# Patient Record
Sex: Female | Born: 1960 | Race: Black or African American | Hispanic: No | Marital: Married | Smoking: Never smoker
Health system: Southern US, Community
[De-identification: ages and names within clinical notes are randomized; demographics above are authoritative.]

---

## 2020-12-08 ENCOUNTER — Emergency Department (HOSPITAL_BASED_OUTPATIENT_CLINIC_OR_DEPARTMENT_OTHER): Payer: 59

## 2020-12-08 ENCOUNTER — Emergency Department (HOSPITAL_BASED_OUTPATIENT_CLINIC_OR_DEPARTMENT_OTHER)
Admission: EM | Admit: 2020-12-08 | Discharge: 2020-12-08 | Disposition: A | Discharge status: Home / Self Care | Payer: 59 | Attending: Emergency Medicine | Admitting: Emergency Medicine

## 2020-12-08 ENCOUNTER — Encounter (HOSPITAL_BASED_OUTPATIENT_CLINIC_OR_DEPARTMENT_OTHER): Payer: Self-pay | Admitting: *Deleted

## 2020-12-08 ENCOUNTER — Other Ambulatory Visit: Payer: Self-pay

## 2020-12-08 DIAGNOSIS — M25531 Pain in right wrist: Secondary | ICD-10-CM | POA: Insufficient documentation

## 2020-12-08 DIAGNOSIS — M542 Cervicalgia: Secondary | ICD-10-CM | POA: Insufficient documentation

## 2020-12-08 DIAGNOSIS — Y92410 Unspecified street and highway as the place of occurrence of the external cause: Secondary | ICD-10-CM | POA: Insufficient documentation

## 2020-12-08 DIAGNOSIS — M79604 Pain in right leg: Secondary | ICD-10-CM | POA: Insufficient documentation

## 2020-12-08 DIAGNOSIS — M545 Low back pain, unspecified: Secondary | ICD-10-CM | POA: Insufficient documentation

## 2020-12-08 DIAGNOSIS — M79605 Pain in left leg: Secondary | ICD-10-CM | POA: Insufficient documentation

## 2020-12-08 MED ORDER — ACETAMINOPHEN 500 MG PO TABS
1000.0000 mg | ORAL_TABLET | Freq: Once | ORAL | Status: AC
  Administered 2020-12-08: 1000 mg via ORAL
  Filled 2020-12-08: qty 2

## 2020-12-08 MED ORDER — METHOCARBAMOL 500 MG PO TABS: 500.0000 mg | ORAL_TABLET | Freq: Three times a day (TID) | ORAL | 0 refills | Status: AC | PRN
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
## 2020-12-08 NOTE — Discharge Instructions (Addendum)
The imaging of your neck, wrist, and back were all without any fractures.   You have an incidental finding of a nodule on your thyroid that I recommend following up with your PCP about  Your pain will likely worsen over the next couple of days. Prescribed muscle relaxer Robaxin 1 tablet every 8 hours as needed. Keep moving around and doing stretches. Return if you develop severe headaches, change in vision or loss of sensation.
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
## 2020-12-08 NOTE — ED Notes (Signed)
Patient transported to X-ray 

## 2022-11-07 IMAGING — CR DG LUMBAR SPINE COMPLETE 4+V
5 series · 5 of 5 positions shown · non-contrast
Comparison: None.

CLINICAL DATA: low back pain with radiation down legs

EXAM:
LUMBAR SPINE - COMPLETE 4+ VIEW

[t l-spine a.p.]
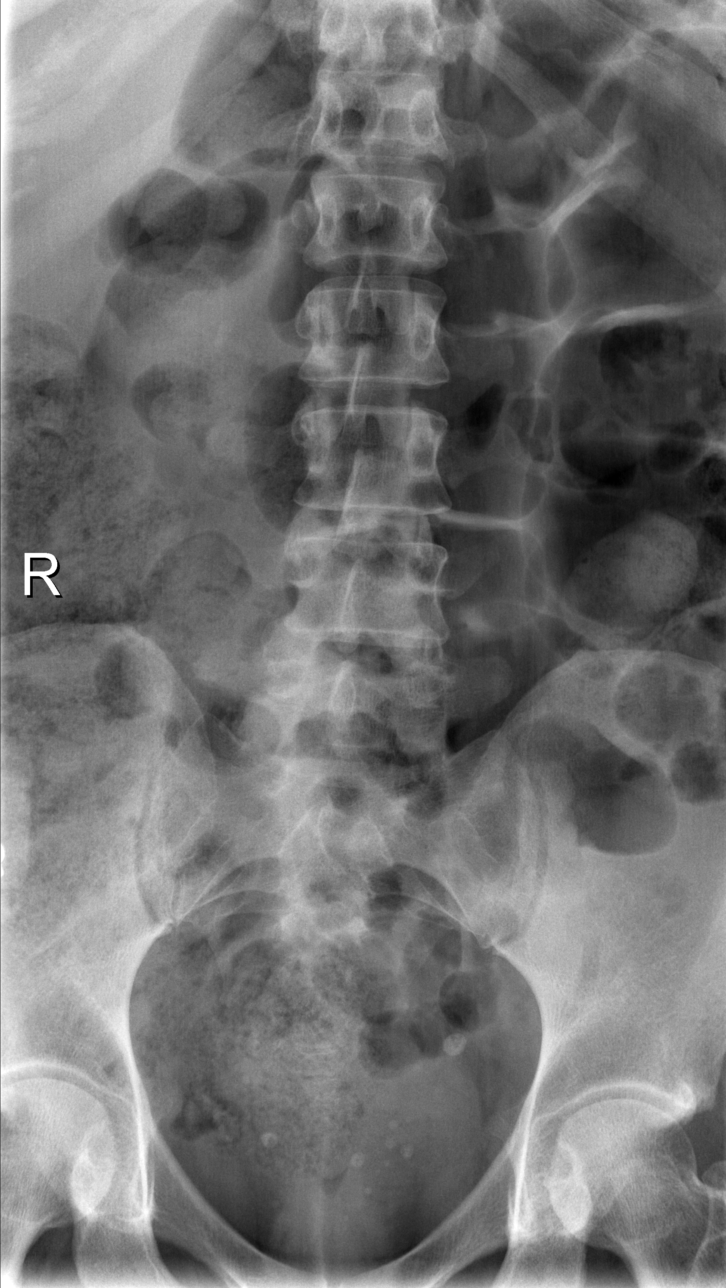

[t l-spine oblique exposure (1 of 3)]
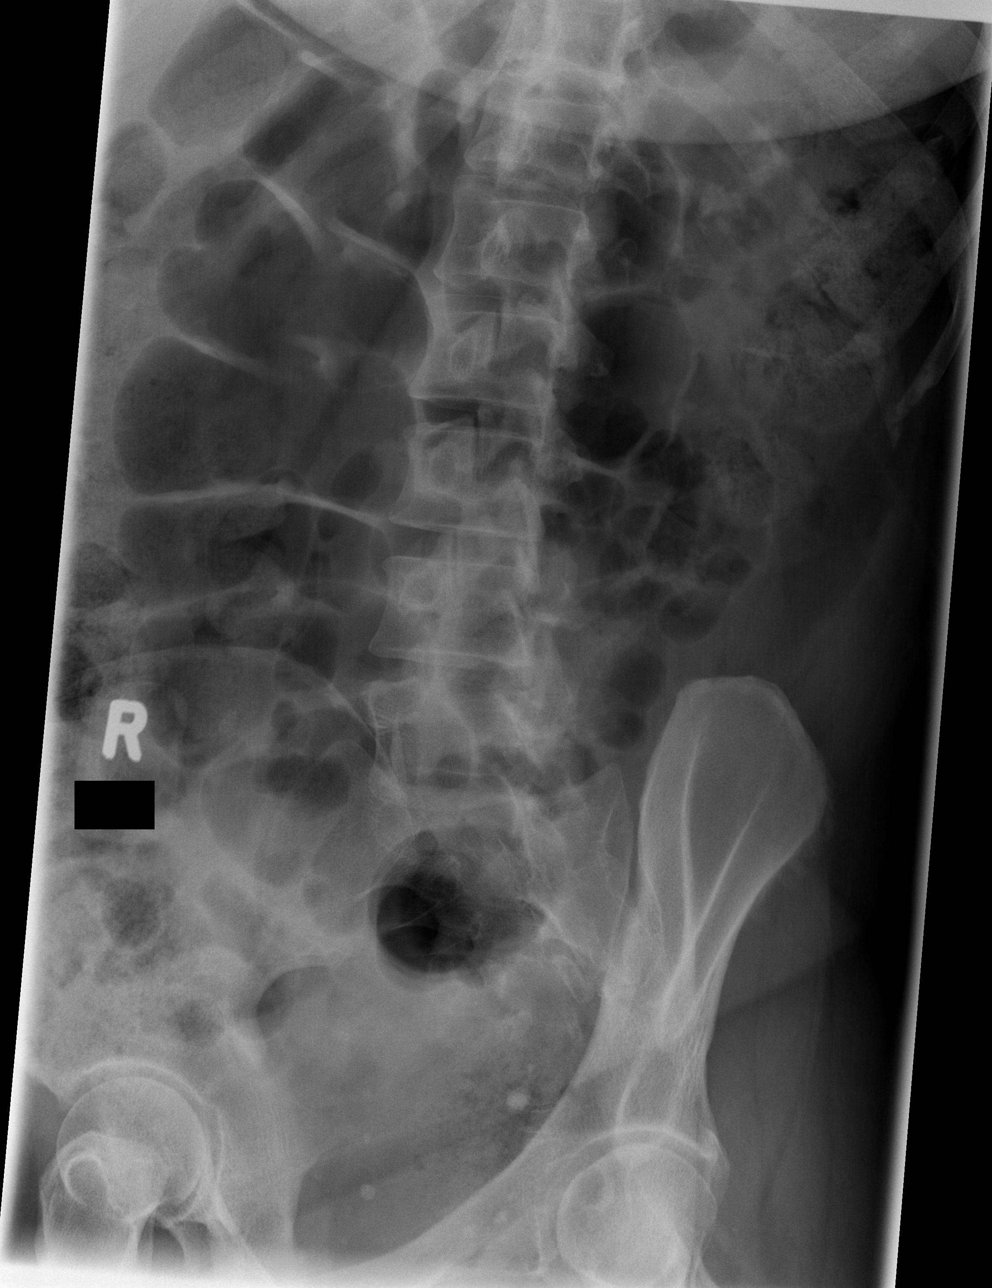

[t l-spine oblique exposure (2 of 3)]
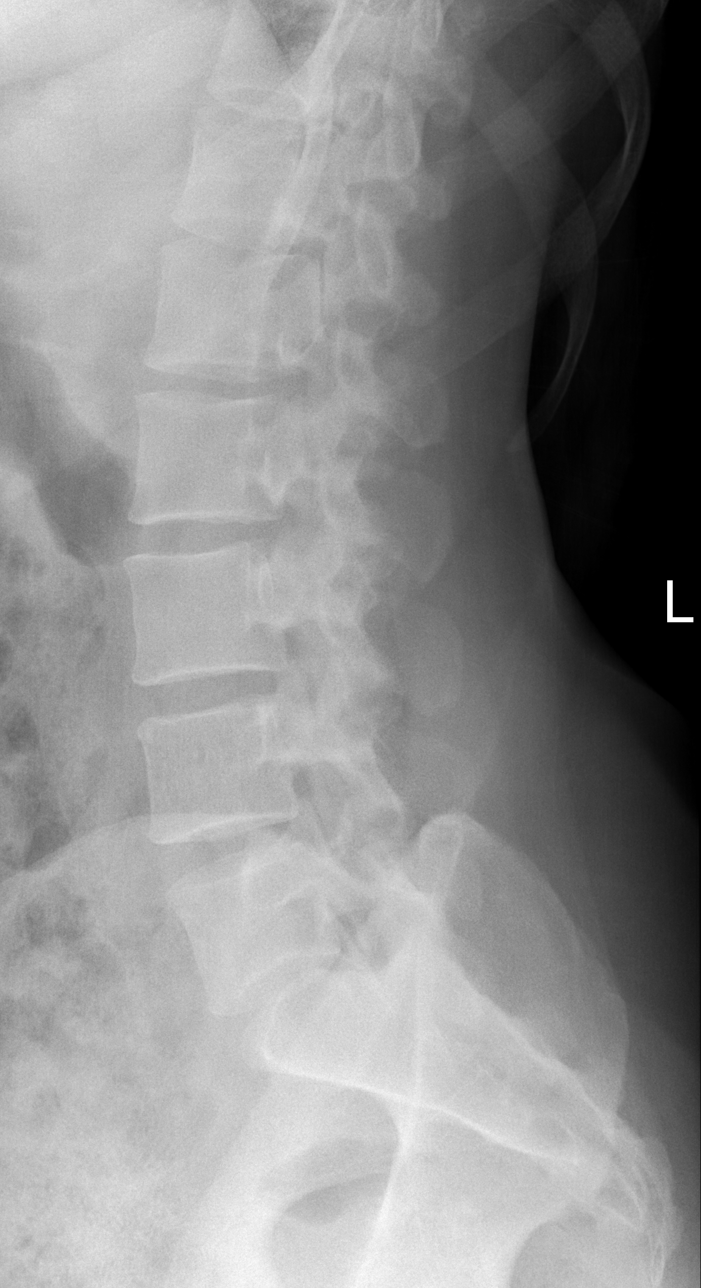

[t l-spine oblique exposure (3 of 3)]
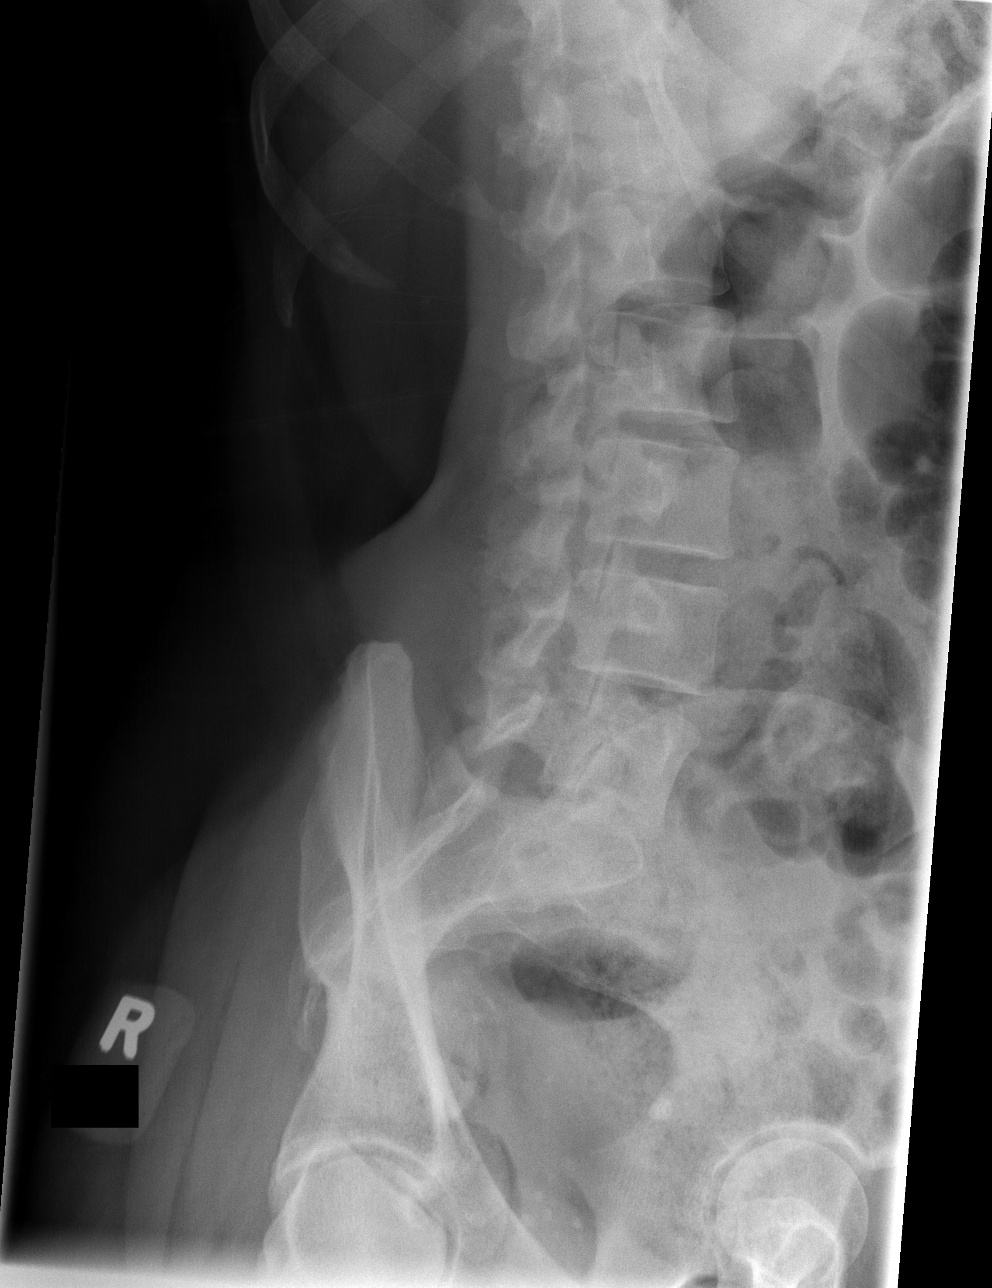

[t l-spine lat]
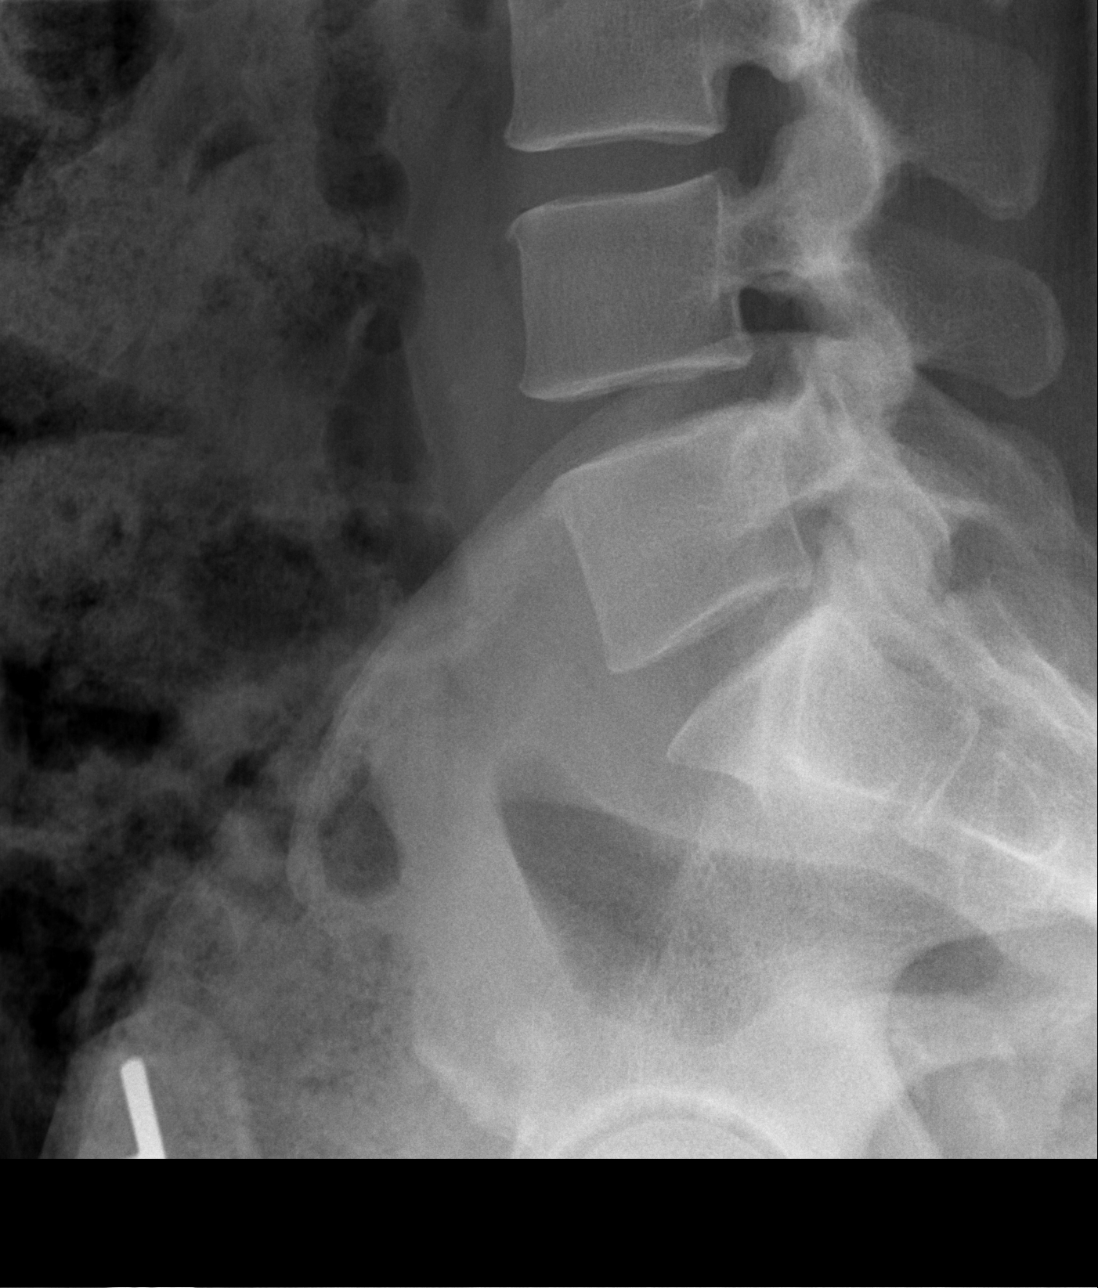

[5 of 5 positions shown; findings below may reference images not displayed]

FINDINGS: Six non-rib-bearing vertebral bodies. There is no evidence of lumbar
spine fracture. Alignment is normal. Intervertebral disc spaces are
maintained.
IMPRESSION: Negative.
